# Patient Record
Sex: Female | Born: 1965 | Race: White | Hispanic: No | Marital: Married | Smoking: Never smoker
Health system: Southern US, Community
[De-identification: ages and names within clinical notes are randomized; demographics above are authoritative.]

## PROBLEM LIST (undated history)

## (undated) DIAGNOSIS — C801 Malignant (primary) neoplasm, unspecified: Secondary | ICD-10-CM

## (undated) HISTORY — PX: BREAST SURGERY: SHX581

## (undated) HISTORY — PX: ABDOMINAL HYSTERECTOMY: SHX81

---

## 2021-01-09 ENCOUNTER — Emergency Department (HOSPITAL_BASED_OUTPATIENT_CLINIC_OR_DEPARTMENT_OTHER)
Admission: EM | Admit: 2021-01-09 | Discharge: 2021-01-09 | Disposition: A | Discharge status: Home / Self Care | Payer: Managed Care, Other (non HMO) | Attending: Emergency Medicine | Admitting: Emergency Medicine

## 2021-01-09 ENCOUNTER — Encounter (HOSPITAL_BASED_OUTPATIENT_CLINIC_OR_DEPARTMENT_OTHER): Payer: Self-pay | Admitting: Emergency Medicine

## 2021-01-09 ENCOUNTER — Other Ambulatory Visit: Payer: Self-pay

## 2021-01-09 ENCOUNTER — Emergency Department (HOSPITAL_BASED_OUTPATIENT_CLINIC_OR_DEPARTMENT_OTHER): Payer: Managed Care, Other (non HMO)

## 2021-01-09 ENCOUNTER — Telehealth (HOSPITAL_BASED_OUTPATIENT_CLINIC_OR_DEPARTMENT_OTHER): Payer: Self-pay | Admitting: Emergency Medicine

## 2021-01-09 DIAGNOSIS — R1011 Right upper quadrant pain: Secondary | ICD-10-CM

## 2021-01-09 DIAGNOSIS — K8080 Other cholelithiasis without obstruction: Secondary | ICD-10-CM

## 2021-01-09 DIAGNOSIS — Z859 Personal history of malignant neoplasm, unspecified: Secondary | ICD-10-CM | POA: Diagnosis not present

## 2021-01-09 DIAGNOSIS — Z20822 Contact with and (suspected) exposure to covid-19: Secondary | ICD-10-CM | POA: Diagnosis not present

## 2021-01-09 DIAGNOSIS — K8050 Calculus of bile duct without cholangitis or cholecystitis without obstruction: Secondary | ICD-10-CM | POA: Insufficient documentation

## 2021-01-09 DIAGNOSIS — R1013 Epigastric pain: Secondary | ICD-10-CM | POA: Diagnosis present

## 2021-01-09 HISTORY — DX: Malignant (primary) neoplasm, unspecified: C80.1

## 2021-01-09 LAB — URINALYSIS, MICROSCOPIC (REFLEX)

## 2021-01-09 LAB — CBC WITH DIFFERENTIAL/PLATELET
Abs Immature Granulocytes: 0.05 10*3/uL (ref 0.00–0.07)
Basophils Absolute: 0 10*3/uL (ref 0.0–0.1)
Basophils Relative: 0 %
Eosinophils Absolute: 0 10*3/uL (ref 0.0–0.5)
Eosinophils Relative: 0 %
HCT: 36.2 % (ref 36.0–46.0)
Hemoglobin: 12.2 g/dL (ref 12.0–15.0)
Immature Granulocytes: 1 %
Lymphocytes Relative: 17 %
Lymphs Abs: 1.8 10*3/uL (ref 0.7–4.0)
MCH: 29 pg (ref 26.0–34.0)
MCHC: 33.7 g/dL (ref 30.0–36.0)
MCV: 86.2 fL (ref 80.0–100.0)
Monocytes Absolute: 0.3 10*3/uL (ref 0.1–1.0)
Monocytes Relative: 3 %
Neutro Abs: 8.6 10*3/uL — ABNORMAL HIGH (ref 1.7–7.7)
Neutrophils Relative %: 79 %
Platelets: 187 10*3/uL (ref 150–400)
RBC: 4.2 MIL/uL (ref 3.87–5.11)
RDW: 12.6 % (ref 11.5–15.5)
WBC: 10.8 10*3/uL — ABNORMAL HIGH (ref 4.0–10.5)
nRBC: 0 % (ref 0.0–0.2)

## 2021-01-09 LAB — RESP PANEL BY RT-PCR (FLU A&B, COVID) ARPGX2
Influenza A by PCR: NEGATIVE
Influenza B by PCR: NEGATIVE
SARS Coronavirus 2 by RT PCR: NEGATIVE

## 2021-01-09 LAB — TROPONIN I (HIGH SENSITIVITY): Troponin I (High Sensitivity): <2 ng/L (ref <18)

## 2021-01-09 LAB — URINALYSIS, ROUTINE W REFLEX MICROSCOPIC
Bilirubin Urine: NEGATIVE
Glucose, UA: NEGATIVE mg/dL
Ketones, ur: NEGATIVE mg/dL
Leukocytes,Ua: NEGATIVE
Nitrite: NEGATIVE
Protein, ur: NEGATIVE mg/dL
Specific Gravity, Urine: 1.03 (ref 1.005–1.030)
pH: 6 (ref 5.0–8.0)

## 2021-01-09 LAB — COMPREHENSIVE METABOLIC PANEL
ALT: 26 U/L (ref 0–44)
AST: 26 U/L (ref 15–41)
Albumin: 4.1 g/dL (ref 3.5–5.0)
Alkaline Phosphatase: 55 U/L (ref 38–126)
Anion gap: 8 (ref 5–15)
BUN: 12 mg/dL (ref 6–20)
CO2: 21 mmol/L — ABNORMAL LOW (ref 22–32)
Calcium: 9.4 mg/dL (ref 8.9–10.3)
Chloride: 105 mmol/L (ref 98–111)
Creatinine, Ser: 0.66 mg/dL (ref 0.44–1.00)
GFR, Estimated: >60 mL/min (ref >60)
Glucose, Bld: 121 mg/dL — ABNORMAL HIGH (ref 70–99)
Potassium: 4 mmol/L (ref 3.5–5.1)
Sodium: 134 mmol/L — ABNORMAL LOW (ref 135–145)
Total Bilirubin: 0.5 mg/dL (ref 0.3–1.2)
Total Protein: 7.2 g/dL (ref 6.5–8.1)

## 2021-01-09 LAB — LIPASE, BLOOD: Lipase: 32 U/L (ref 11–51)

## 2021-01-09 MED ORDER — HYDROCODONE-ACETAMINOPHEN 5-325 MG PO TABS: 2.0000 | ORAL_TABLET | ORAL | 0 refills | Status: AC | PRN

## 2021-01-09 MED ORDER — DICYCLOMINE HCL 20 MG PO TABS: 20.0000 mg | ORAL_TABLET | Freq: Two times a day (BID) | ORAL | 0 refills | Status: AC

## 2021-01-09 MED ORDER — ALUM & MAG HYDROXIDE-SIMETH 200-200-20 MG/5ML PO SUSP
15.0000 mL | Freq: Once | ORAL | Status: AC
  Administered 2021-01-09: 15 mL via ORAL
  Filled 2021-01-09: qty 30

## 2021-01-09 MED ORDER — SODIUM CHLORIDE 0.9 % IV BOLUS
1000.0000 mL | Freq: Once | INTRAVENOUS | Status: AC
  Administered 2021-01-09: 1000 mL via INTRAVENOUS

## 2021-01-09 MED ORDER — FAMOTIDINE IN NACL 20-0.9 MG/50ML-% IV SOLN
20.0000 mg | Freq: Once | INTRAVENOUS | Status: AC
  Administered 2021-01-09: 20 mg via INTRAVENOUS
  Filled 2021-01-09: qty 50

## 2021-01-09 MED ORDER — SUCRALFATE 1 G PO TABS: 1.0000 g | ORAL_TABLET | Freq: Three times a day (TID) | ORAL | 0 refills | Status: AC

## 2021-01-09 MED ORDER — AMOXICILLIN-POT CLAVULANATE 875-125 MG PO TABS: 1.0000 | ORAL_TABLET | Freq: Two times a day (BID) | ORAL | 0 refills | Status: AC

## 2021-01-09 MED ORDER — IOHEXOL 300 MG/ML  SOLN
100.0000 mL | Freq: Once | INTRAMUSCULAR | Status: AC | PRN
  Administered 2021-01-09: 100 mL via INTRAVENOUS

## 2021-01-09 NOTE — ED Triage Notes (Signed)
Abdominal pain since 11 pm.  Heartburn started prior at 9 pm which has improved.  Some nausea.  Some diarrhea.  No known fever.

## 2021-01-09 NOTE — Telephone Encounter (Signed)
Sent antibiotics to pt pharmacy

## 2021-01-09 NOTE — ED Provider Notes (Addendum)
[facility] Provider Note   CSN: [id number] Arrival date & time: 01/09/21  1517     History Chief Complaint  Patient presents with   Abdominal Pain    Rebecca Riggs is a 55 y.o. female.  This is a 55 y.o. female with significant medical history as below, including chronic acid reflux, IBS who presents to the ED with complaint of concern for indigestion, abdominal pain.  Location:  [facility] Duration:  12-16 hrs Onset: Gradual Timing: Constant Description: Burning, tightness Severity: Mild Exacerbating/Alleviating Factors: Mildly improved with antacid medications at home Associated Symptoms: Mild nausea without emesis, burning to back of throat, she has chronic diarrhea Pertinent Negatives: No fevers, chills, vomiting, rashes, change to bowel or bladder function, no melena, no trauma, no recent medication changes  Context: Patient with known history of IBS, received endoscopy and colonoscopy approximate 5 months ago [facility] gastroenterology.  Patient ate a hamburger yesterday and began to have sensation of acid reflux, not resolved with her OTC PPI.  No medication this morning prior to arrival.  No chronic NSAID or alcohol use.   The history is provided by the patient. No language interpreter was used.  Abdominal Pain Associated symptoms: nausea   Associated symptoms: no chest pain, no chills, no cough, no fever, no hematuria, no shortness of breath and no vomiting       Past Medical History:  Diagnosis Date   Cancer ([place])     There are no problems to display for this patient.   Past Surgical History:  Procedure Laterality Date   ABDOMINAL HYSTERECTOMY     BREAST SURGERY       OB History   No obstetric history on file.     History reviewed. No pertinent family history.  Social History   Tobacco Use   Smoking status: Never   Smokeless tobacco: Never    Home Medications Prior to Admission medications   Medication Sig  Start Date End Date Taking? Authorizing Provider  dicyclomine (BENTYL) 20 MG tablet Take 1 tablet (20 mg total) by mouth 2 (two) times daily. 01/09/21  Yes Jeanell Sparrow, DO  HYDROcodone-acetaminophen (NORCO/VICODIN) 5-325 MG tablet Take 2 tablets by mouth every 4 (four) hours as needed. 01/09/21  Yes Wynona Dove A, DO  sucralfate (CARAFATE) 1 g tablet Take 1 tablet (1 g total) by mouth 4 (four) times daily -  with meals and at bedtime for 14 days. 01/09/21 01/23/21 Yes Jeanell Sparrow, DO    Allergies    Patient has no known allergies.  Review of Systems   Review of Systems  Constitutional:  Negative for chills and fever.  HENT:  Negative for facial swelling and trouble swallowing.   Eyes:  Negative for photophobia and visual disturbance.  Respiratory:  Negative for cough and shortness of breath.   Cardiovascular:  Negative for chest pain and palpitations.  Gastrointestinal:  Positive for abdominal pain and nausea. Negative for vomiting.  Endocrine: Negative for polydipsia and polyuria.  Genitourinary:  Negative for difficulty urinating and hematuria.  Musculoskeletal:  Negative for gait problem and joint swelling.  Skin:  Negative for pallor and rash.  Neurological:  Negative for syncope and headaches.  Psychiatric/Behavioral:  Negative for agitation and confusion.    Physical Exam Updated Vital Signs BP (!) 146/76   Pulse 70   Temp 99.8 F (37.7 C) (Oral)   Resp 20   Ht 5\' 4"  (1.626 m)   Wt 79.4 kg   SpO2  100%   BMI 30.04 kg/m   Physical Exam Vitals and nursing note reviewed.  Constitutional:      General: She is not in acute distress.    Appearance: Normal appearance.  HENT:     Head: Normocephalic and atraumatic.     Right Ear: External ear normal.     Left Ear: External ear normal.     Nose: Nose normal.     Mouth/Throat:     Mouth: Mucous membranes are moist.  Eyes:     General: No scleral icterus.       Right eye: No discharge.        Left eye: No  discharge.  Cardiovascular:     Rate and Rhythm: Normal rate and regular rhythm.     Pulses: Normal pulses.     Heart sounds: Normal heart sounds.  Pulmonary:     Effort: Pulmonary effort is normal. No respiratory distress.     Breath sounds: Normal breath sounds.  Abdominal:     General: Abdomen is flat.     Palpations: Abdomen is soft.     Tenderness: There is abdominal tenderness in the epigastric area. There is no guarding or rebound.  Musculoskeletal:        General: Normal range of motion.     Cervical back: Normal range of motion.     Right lower leg: No edema.     Left lower leg: No edema.  Skin:    General: Skin is warm and dry.     Capillary Refill: Capillary refill takes less than 2 seconds.  Neurological:     Mental Status: She is alert.  Psychiatric:        Mood and Affect: Mood normal.        Behavior: Behavior normal.    ED Results / Procedures / Treatments   Labs (all labs ordered are listed, but only abnormal results are displayed) Labs Reviewed  CBC WITH DIFFERENTIAL/PLATELET - Abnormal; Notable for the following components:      Result Value   WBC 10.8 (*)    Neutro Abs 8.6 (*)    All other components within normal limits  COMPREHENSIVE METABOLIC PANEL - Abnormal; Notable for the following components:   Sodium 134 (*)    CO2 21 (*)    Glucose, Bld 121 (*)    All other components within normal limits  URINALYSIS, ROUTINE W REFLEX MICROSCOPIC - Abnormal; Notable for the following components:   Hgb urine dipstick SMALL (*)    All other components within normal limits  URINALYSIS, MICROSCOPIC (REFLEX) - Abnormal; Notable for the following components:   Bacteria, UA RARE (*)    All other components within normal limits  RESP PANEL BY RT-PCR (FLU A&B, COVID) ARPGX2  LIPASE, BLOOD  TROPONIN I (HIGH SENSITIVITY)    EKG EKG Interpretation  Date/Time:  Thursday January 09 2021 10:05:39 EST Ventricular Rate:  65 PR Interval:  159 QRS Duration: 99 QT  Interval:  429 QTC Calculation: 447 R Axis:   28 Text Interpretation: Sinus rhythm RSR' in V1 or V2, right VCD or RVH Confirmed by Wynona Dove ([id number]) on 01/09/2021 2:59:10 PM  Radiology CT ABDOMEN PELVIS W CONTRAST  Result Date: 01/09/2021 CLINICAL DATA:  Epigastric pain EXAM: CT ABDOMEN AND PELVIS WITH CONTRAST TECHNIQUE: Multidetector CT imaging of the abdomen and pelvis was performed using the standard protocol following bolus administration of intravenous contrast. CONTRAST:  179mL OMNIPAQUE IOHEXOL 300 MG/ML  SOLN COMPARISON:  09/06/2020 FINDINGS: Lower  chest: Included lung bases are clear.  Heart size is normal. Hepatobiliary: Diffusely decreased attenuation of the hepatic parenchyma. No focal liver lesion identified. Multiple gallstones are seen within the gallbladder including stone located at the gallbladder neck. Gallbladder wall appears slightly thickened. Mild pericholecystic fat stranding (series 2, image 28). No biliary dilatation. Pancreas: Unremarkable. No pancreatic ductal dilatation or surrounding inflammatory changes. Spleen: Normal in size without focal abnormality. Adrenals/Urinary Tract: Unremarkable adrenal glands. 6 mm nonobstructing stone again seen within the lower pole of the right kidney. Subcentimeter low-density lesion within the right kidney, too small to definitively characterize but most likely represents a cyst. Early contrast excretion is seen within the bilateral renal collecting systems. No left-sided renal stone. No hydronephrosis of either kidney. Ureters and urinary bladder within normal limits. Stomach/Bowel: Small hiatal hernia. Stomach otherwise unremarkable. No dilated loops of bowel. Mildly thickened short segment of colon in the region of the hepatic flexure, which may be reactive. Normal appendix in the right lower quadrant. Vascular/Lymphatic: No significant vascular findings are present. No enlarged abdominal or pelvic lymph nodes. Reproductive: Status post  hysterectomy. No adnexal masses. Other: No free fluid. No abdominopelvic fluid collection. No pneumoperitoneum. Tiny fat containing periumbilical hernia. Musculoskeletal: No acute or significant osseous findings. IMPRESSION: 1. Cholelithiasis with findings concerning for acute cholecystitis. This could be further evaluated with right upper quadrant ultrasound, as clinically indicated. 2. Mildly thickened short segment of colon in the region of the hepatic flexure, which may be reactive secondary to adjacent gallbladder inflammation. 3. Nonobstructing right renal stone. 4. Hepatic steatosis. 5. Small hiatal hernia. Electronically Signed   By: Davina Poke D.O.   On: 01/09/2021 11:27   US Abdomen Limited RUQ (LIVER/GB)  Result Date: 01/09/2021 CLINICAL DATA:  Abdominal pain.  Evaluate for acute cholecystitis. EXAM: ULTRASOUND ABDOMEN LIMITED RIGHT UPPER QUADRANT COMPARISON:  None. FINDINGS: Gallbladder: Cholelithiasis. Mild gallbladder wall thickening to 3.6 mm. No pericholecystic fluid definitely identified. No Murphy's sign. Common bile duct: Diameter: 2.3 mm Liver: Increased echogenicity with no focal mass. Portal vein is patent on color Doppler imaging with normal direction of blood flow towards the liver. Other: None. IMPRESSION: 1. Cholelithiasis and mild gallbladder wall thickening without pericholecystic fluid or Murphy's sign. The findings of the ultrasound are nonspecific. However, the CT scan from today was at least moderately worrisome for acute cholecystitis. If the clinical picture remains ambiguous, recommend a HIDA scan. The CT scan from earlier today demonstrated a stone in the neck of the gallbladder. Electronically Signed   By: Dorise Bullion III M.D.   On: 01/09/2021 12:43    Procedures Procedures   Medications Ordered in ED Medications  sodium chloride 0.9 % bolus 1,000 mL (0 mLs Intravenous Stopped 01/09/21 1155)  famotidine (PEPCID) IVPB 20 mg premix (0 mg Intravenous Stopped  01/09/21 1155)  alum & mag hydroxide-simeth (MAALOX/MYLANTA) 200-200-20 MG/5ML suspension 15 mL (15 mLs Oral Given 01/09/21 1015)  iohexol (OMNIPAQUE) 300 MG/ML solution 100 mL (100 mLs Intravenous Contrast Given 01/09/21 1056)    ED Course  I have reviewed the triage vital signs and the nursing notes.  Pertinent labs & imaging results that were available during my care of the patient were reviewed by me and considered in my medical decision making (see chart for details).    MDM Rules/Calculators/A&P                           CC: Abdominal pain  This patient complains of  above; this involves an extensive number of treatment options and is a complaint that carries with it a high risk of complications and morbidity. Vital signs were reviewed. Serious etiologies considered.  Record review:   Previous records obtained and reviewed    Work up as above, notable for:  Labs & imaging results that were available during my care of the patient were reviewed by me and considered in my medical decision making.   I ordered imaging studies which included CTAP and I independently visualized and interpreted imaging which showed cholelithiasis, RUQ U/s was obtained which was not reflective of acute cholecystitis.   Management: Patient given GI cocktail, IV fluids  Reassessment:  Pt reports she is feeling much better. She is able to tolerate oral intake. I discussed imaging and lab findings with the patient. Given stable ultrasound and lack of evidence of obstruction, stable cmp. Pt has elected to trial outpatient therapy/low fat diet and will follow up with general surgery as outpatient. I did offer my recommendation for surgical evaluation but she would hopefully like to avoid surgery if possible. Advised patient to return to the ED if her symptoms worsen in any way, do not improve, or if she would like to elect to have surgical evaluation.     The patient improved significantly and was  discharged in stable condition. Detailed discussions were had with the patient regarding current findings, and need for close f/u with PCP or on call doctor. The patient has been instructed to return immediately if the symptoms worsen in any way for re-evaluation. Patient verbalized understanding and is in agreement with current care plan. All questions answered prior to discharge.      This chart was dictated using voice recognition software.  Despite best efforts to proofread,  errors can occur which can change the documentation meaning.  Final Clinical Impression(s) / ED Diagnoses Final diagnoses:  RUQ pain  Biliary calculus of other site without obstruction    Rx / DC Orders ED Discharge Orders          Ordered    HYDROcodone-acetaminophen (NORCO/VICODIN) 5-325 MG tablet  Every 4 hours PRN        01/09/21 1348    dicyclomine (BENTYL) 20 MG tablet  2 times daily        01/09/21 1348    sucralfate (CARAFATE) 1 g tablet  3 times daily with meals & bedtime        01/09/21 1348             [place], [place], DO 01/09/21 1503    Jeanell Sparrow, DO 01/09/21 1734

## 2021-01-09 NOTE — ED Notes (Signed)
US at bedside

## 2021-01-09 NOTE — ED Notes (Signed)
ED Provider at bedside. 

## 2021-01-09 NOTE — Discharge Instructions (Addendum)
Please return to the ED if your symptoms worsen or do not improve.

## 2022-12-17 IMAGING — US US ABDOMEN LIMITED
1 series · 14 of 25 positions shown · non-contrast
Comparison: None.

CLINICAL DATA: Abdominal pain.  Evaluate for acute cholecystitis.

EXAM:
ULTRASOUND ABDOMEN LIMITED RIGHT UPPER QUADRANT

[Series 1: us abdomen limited · 14 of 41 slices shown]
[im 1/41]
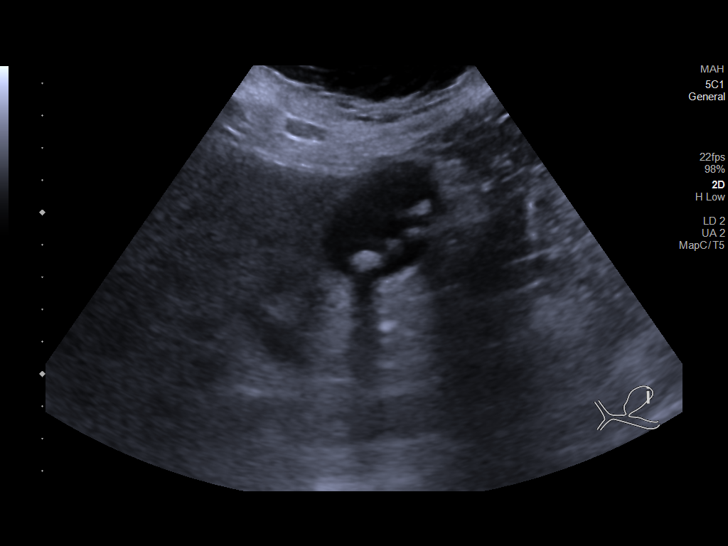
[im 4/41]
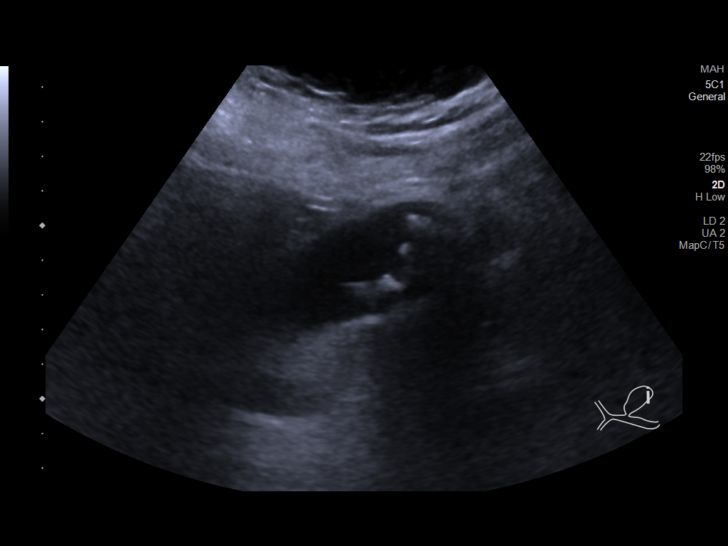
[im 7/41]
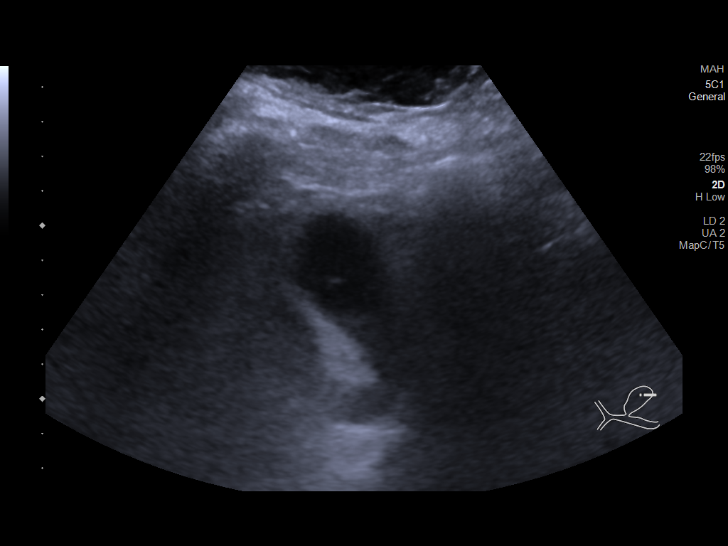
[im 11/41]
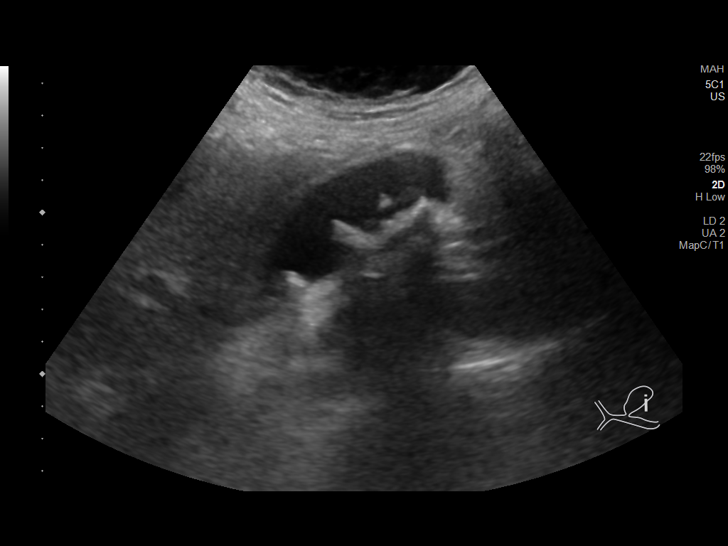
[im 14/41]
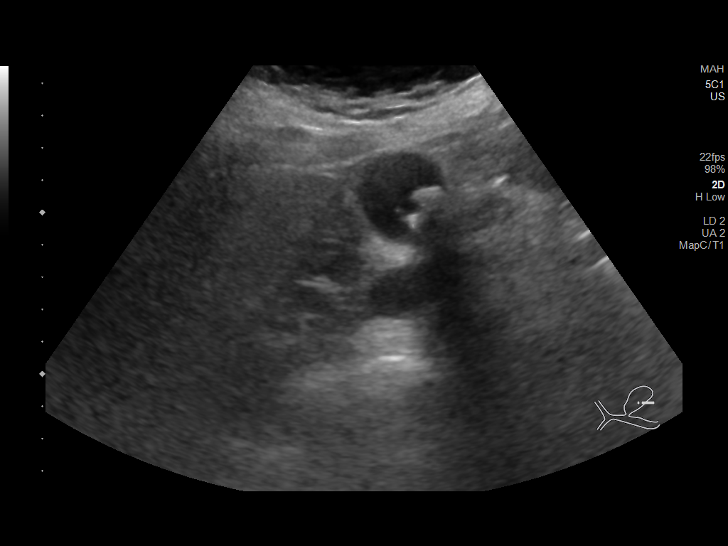
[im 16/41]
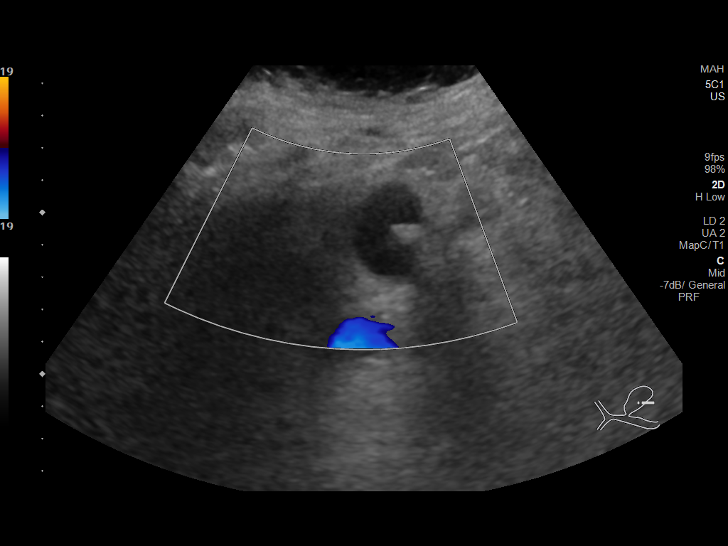
[im 19/41]
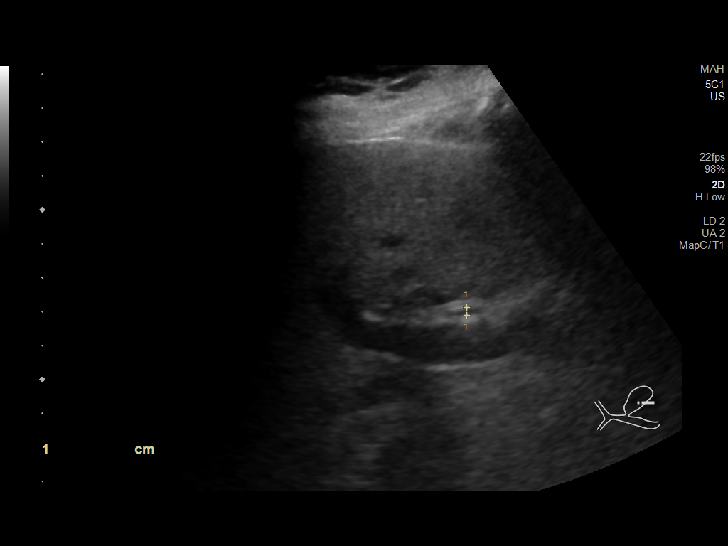
[im 22/41]
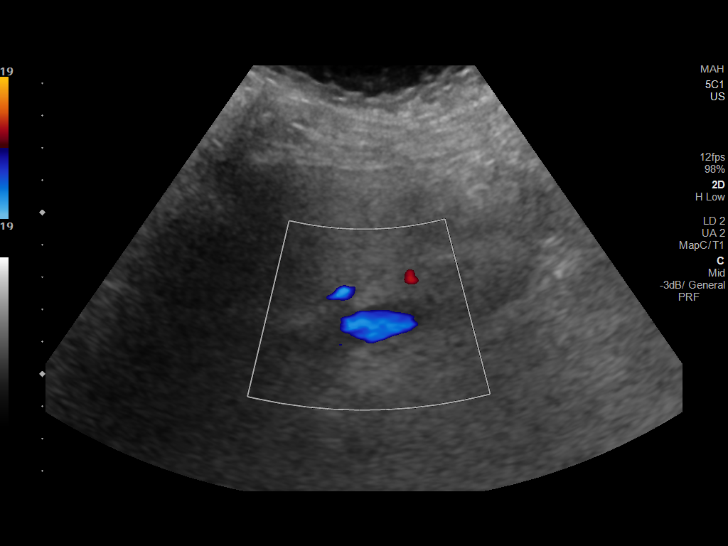
[im 26/41]
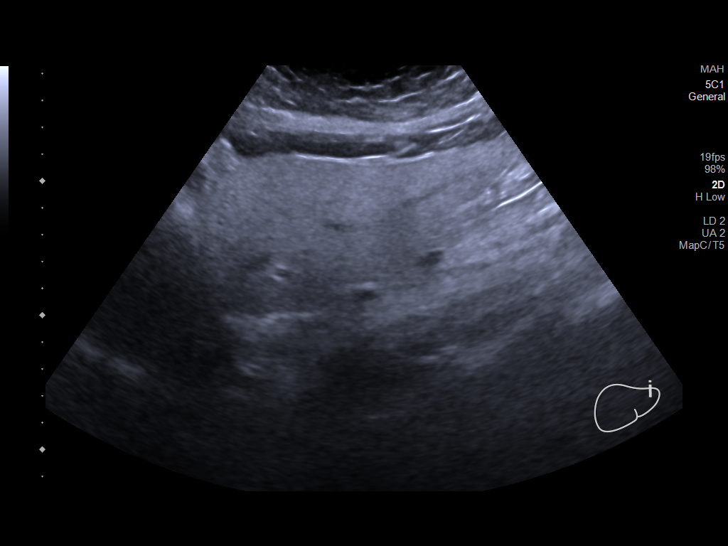
[im 27/41]
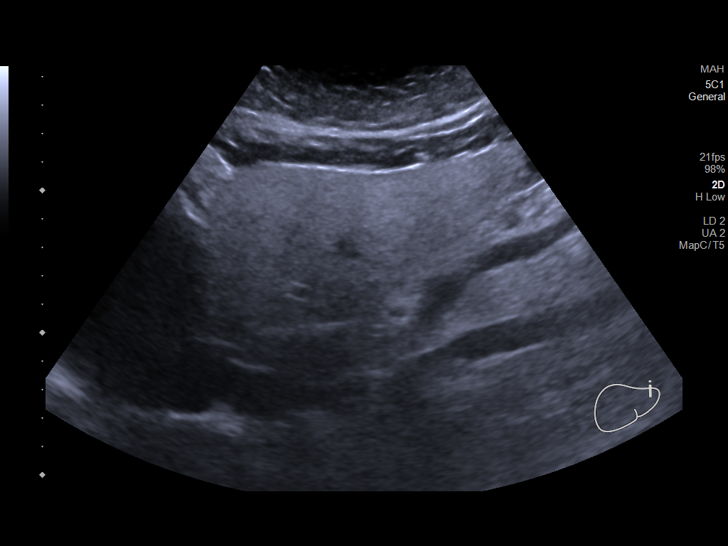
[im 31/41]
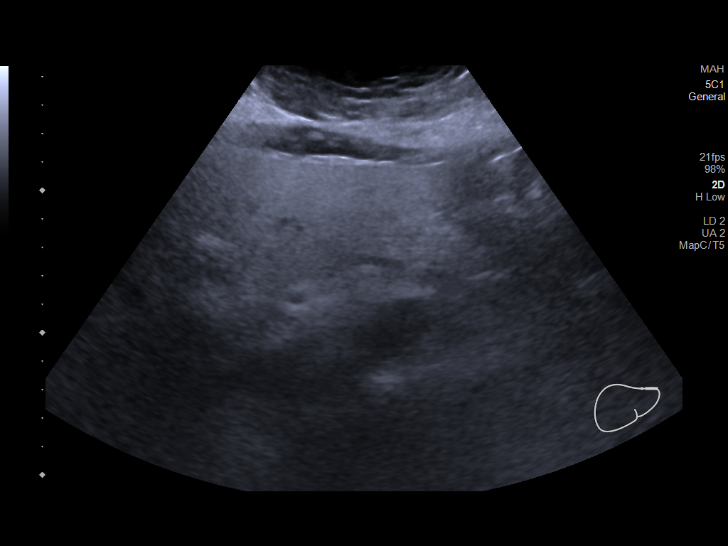
[im 34/41]
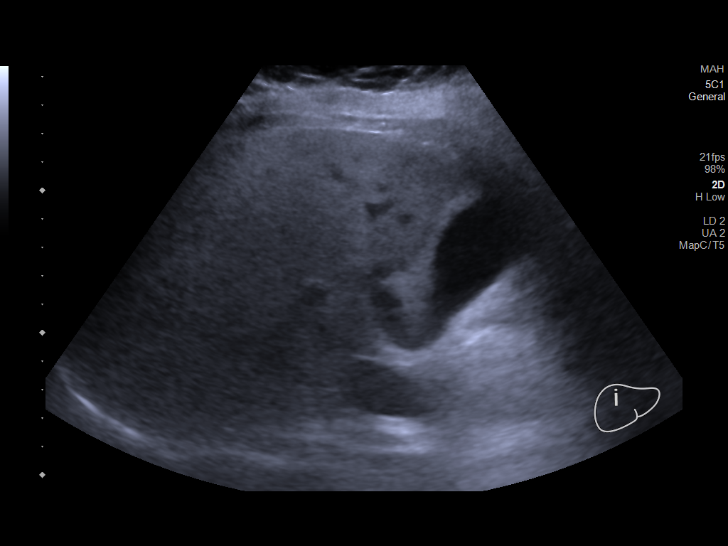
[im 37/41]
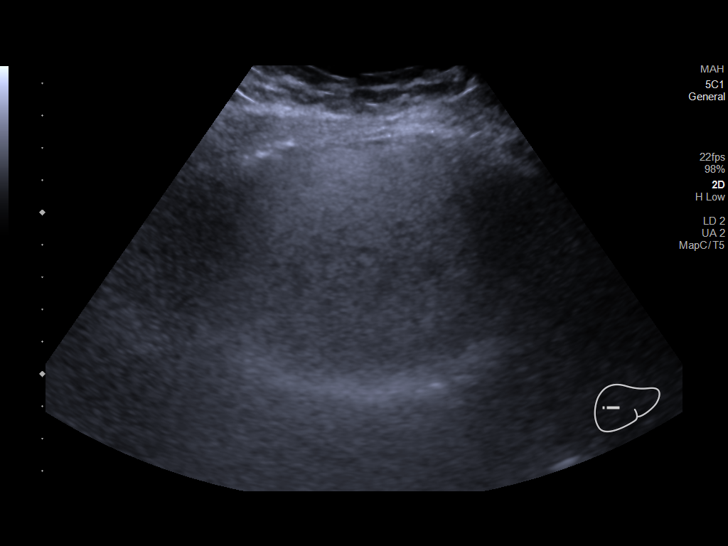
[im 41/41]
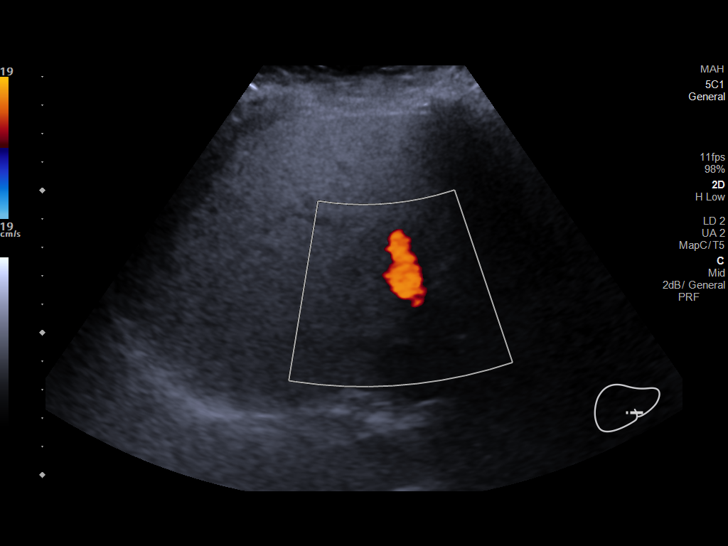

[14 of 25 positions shown; findings below may reference images not displayed]

FINDINGS: Gallbladder:

Cholelithiasis. Mild gallbladder wall thickening to 3.6 mm. No
pericholecystic fluid definitely identified. No Murphy's sign.

Common bile duct:

Diameter: 2.3 mm

Liver:

Increased echogenicity with no focal mass. Portal vein is patent on
color Doppler imaging with normal direction of blood flow towards
the liver.

Other: None.
IMPRESSION: 1. Cholelithiasis and mild gallbladder wall thickening without
pericholecystic fluid or Murphy's sign. The findings of the
ultrasound are nonspecific. However, the CT scan from today was at
least moderately worrisome for acute cholecystitis. If the clinical
picture remains ambiguous, recommend a HIDA scan. The CT scan from
earlier today demonstrated a stone in the neck of the gallbladder.
